# Patient Record
Sex: Male | Born: 2000 | Race: Black or African American | Hispanic: No | Marital: Single
Health system: Southern US, Community
[De-identification: ages and names within clinical notes are randomized; demographics above are authoritative.]

---
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
## 2022-10-15 DIAGNOSIS — R300 Dysuria: Secondary | ICD-10-CM | POA: Diagnosis not present

## 2022-10-15 DIAGNOSIS — N451 Epididymitis: Secondary | ICD-10-CM | POA: Diagnosis not present

## 2023-01-04 DIAGNOSIS — Z13220 Encounter for screening for lipoid disorders: Secondary | ICD-10-CM | POA: Diagnosis not present

## 2023-01-04 DIAGNOSIS — B360 Pityriasis versicolor: Secondary | ICD-10-CM | POA: Diagnosis not present

## 2023-01-04 DIAGNOSIS — Z1389 Encounter for screening for other disorder: Secondary | ICD-10-CM | POA: Diagnosis not present

## 2023-01-04 DIAGNOSIS — Z131 Encounter for screening for diabetes mellitus: Secondary | ICD-10-CM | POA: Diagnosis not present

## 2023-01-04 DIAGNOSIS — Z1159 Encounter for screening for other viral diseases: Secondary | ICD-10-CM | POA: Diagnosis not present

## 2023-01-04 DIAGNOSIS — K219 Gastro-esophageal reflux disease without esophagitis: Secondary | ICD-10-CM | POA: Diagnosis not present

## 2023-07-11 DIAGNOSIS — K219 Gastro-esophageal reflux disease without esophagitis: Secondary | ICD-10-CM | POA: Diagnosis not present

## 2023-07-11 DIAGNOSIS — Z1389 Encounter for screening for other disorder: Secondary | ICD-10-CM | POA: Diagnosis not present

## 2023-07-11 DIAGNOSIS — R319 Hematuria, unspecified: Secondary | ICD-10-CM | POA: Diagnosis not present

## 2023-07-11 DIAGNOSIS — Z1331 Encounter for screening for depression: Secondary | ICD-10-CM | POA: Diagnosis not present

## 2023-07-11 DIAGNOSIS — Z131 Encounter for screening for diabetes mellitus: Secondary | ICD-10-CM | POA: Diagnosis not present

## 2023-07-11 DIAGNOSIS — B360 Pityriasis versicolor: Secondary | ICD-10-CM | POA: Diagnosis not present

## 2023-07-19 DIAGNOSIS — R319 Hematuria, unspecified: Secondary | ICD-10-CM | POA: Diagnosis not present

## 2023-07-19 DIAGNOSIS — R7989 Other specified abnormal findings of blood chemistry: Secondary | ICD-10-CM | POA: Diagnosis not present

## 2023-07-27 DIAGNOSIS — R319 Hematuria, unspecified: Secondary | ICD-10-CM | POA: Diagnosis not present

## 2023-07-27 DIAGNOSIS — R7989 Other specified abnormal findings of blood chemistry: Secondary | ICD-10-CM | POA: Diagnosis not present

## 2023-08-24 DIAGNOSIS — R7989 Other specified abnormal findings of blood chemistry: Secondary | ICD-10-CM | POA: Diagnosis not present

## 2023-10-02 DIAGNOSIS — R1110 Vomiting, unspecified: Secondary | ICD-10-CM | POA: Diagnosis not present

## 2023-10-13 DIAGNOSIS — R319 Hematuria, unspecified: Secondary | ICD-10-CM | POA: Diagnosis not present

## 2024-03-29 DIAGNOSIS — Z1389 Encounter for screening for other disorder: Secondary | ICD-10-CM | POA: Diagnosis not present

## 2024-03-29 DIAGNOSIS — Z113 Encounter for screening for infections with a predominantly sexual mode of transmission: Secondary | ICD-10-CM | POA: Diagnosis not present

## 2024-03-29 DIAGNOSIS — M545 Low back pain, unspecified: Secondary | ICD-10-CM | POA: Diagnosis not present

## 2024-03-29 DIAGNOSIS — Z1331 Encounter for screening for depression: Secondary | ICD-10-CM | POA: Diagnosis not present
